# Patient Record
Sex: Female | Born: 1990 | Race: Black or African American | Hispanic: No | Marital: Single | Smoking: Never smoker
Health system: Southern US, Community
[De-identification: ages and names within clinical notes are randomized; demographics above are authoritative.]

## PROBLEM LIST (undated history)

## (undated) ENCOUNTER — Inpatient Hospital Stay (HOSPITAL_COMMUNITY): Payer: Self-pay

## (undated) DIAGNOSIS — Z789 Other specified health status: Secondary | ICD-10-CM

## (undated) HISTORY — DX: Other specified health status: Z78.9

## (undated) HISTORY — PX: NO PAST SURGERIES: SHX2092

---

## 2013-10-31 ENCOUNTER — Encounter (HOSPITAL_COMMUNITY): Payer: Self-pay

## 2013-10-31 ENCOUNTER — Inpatient Hospital Stay (HOSPITAL_COMMUNITY)
Admission: AD | Admit: 2013-10-31 | Discharge: 2013-10-31 | Disposition: A | Discharge status: Home / Self Care | Payer: PRIVATE HEALTH INSURANCE | Source: Ambulatory Visit | Attending: Obstetrics & Gynecology | Admitting: Obstetrics & Gynecology

## 2013-10-31 DIAGNOSIS — O26851 Spotting complicating pregnancy, first trimester: Secondary | ICD-10-CM

## 2013-10-31 DIAGNOSIS — O26859 Spotting complicating pregnancy, unspecified trimester: Secondary | ICD-10-CM | POA: Insufficient documentation

## 2013-10-31 LAB — ABO/RH: ABO/RH(D): O POS

## 2013-10-31 LAB — CBC
HCT: 37.2 % (ref 36.0–46.0)
Hemoglobin: 12.8 g/dL (ref 12.0–15.0)
MCH: 29 pg (ref 26.0–34.0)
MCHC: 34.4 g/dL (ref 30.0–36.0)
MCV: 84.2 fL (ref 78.0–100.0)
PLATELETS: 262 10*3/uL (ref 150–400)
RBC: 4.42 MIL/uL (ref 3.87–5.11)
RDW: 12.1 % (ref 11.5–15.5)
WBC: 8.8 10*3/uL (ref 4.0–10.5)

## 2013-10-31 LAB — HCG, QUANTITATIVE, PREGNANCY: HCG, BETA CHAIN, QUANT, S: 46301 m[IU]/mL — AB (ref <5)

## 2013-10-31 LAB — POCT PREGNANCY, URINE: PREG TEST UR: POSITIVE — AB

## 2013-10-31 NOTE — MAU Note (Signed)
Patient states she has not had a period since May. Had light spotting last week but no pain and no spotting now. Has had a pregnancy test at home with a "light line". Wants confirmation. No complaints at this time.

## 2013-10-31 NOTE — MAU Provider Note (Signed)
Chief Complaint: Possible Pregnancy   First Provider Initiated Contact with Patient 10/31/13 1745     SUBJECTIVE HPI: Terri Hess is a 23 y.o. [id number] at 93107w2d by LMP who presents to maternity admissions reporting light spotting last week, faintly positive pregnancy test 2 weeks ago.  When the bleeding occurred, she was dealing with the death of her sister and was emotionally stressed.  The bleeding was light, pink, and only when wiping.  She has not seen bleeding in 1 week.  She denies abdominal pain, vaginal itching/burning, urinary symptoms, h/a, dizziness, n/v, or fever/chills.    [place] moved [place] and reports she has obstetric history including 2 miscarriages and 2 deliveries at 25 weeks, and 26 weeks, with no living children.    History reviewed. No pertinent past medical history. History reviewed. No pertinent past surgical history. History   Social History  . Marital Status: Single    Spouse Name: N/A    Number of Children: N/A  . Years of Education: N/A   Occupational History  . Not on file.   Social History Main Topics  . Smoking status: Never Smoker   . Smokeless tobacco: Never Used  . Alcohol Use: Not on file  . Drug Use: No  . Sexual Activity: Yes   Other Topics Concern  . Not on file   Social History Narrative  . No narrative on file   No current facility-administered medications on file prior to encounter.   No current outpatient prescriptions on file prior to encounter.   Allergies not on file  ROS: Pertinent items in HPI  OBJECTIVE Blood pressure 110/58, pulse 113, temperature 99.1 F (37.3 C), temperature source Oral, resp. rate 16, height 5\' 3"  (1.6 m), weight 71.759 kg (158 lb 3.2 oz), last menstrual period 08/13/2013, SpO2 99.00%. GENERAL: Well-developed, well-nourished female in no acute distress.  HEENT: Normocephalic HEART: normal rate RESP: normal effort ABDOMEN: Soft, non-tender EXTREMITIES: Nontender, no edema NEURO:  Alert and oriented SPECULUM EXAM: Deferred  LAB RESULTS Results for orders placed during the hospital encounter of 10/31/13 (from the past 24 hour(s))  POCT PREGNANCY, URINE     Status: Abnormal   Collection Time    10/31/13  3:28 PM      Result Value Ref Range   Preg Test, Ur POSITIVE (*) NEGATIVE  HCG, QUANTITATIVE, PREGNANCY     Status: Abnormal   Collection Time    10/31/13  3:41 PM      Result Value Ref Range   hCG, Beta Nyra JabsChain, Quant, [place] [id number] (*) <5 mIU/mL  CBC     Status: None   Collection Time    10/31/13  3:42 PM      Result Value Ref Range   WBC 8.8  4.0 - 10.5 K/uL   RBC 4.42  3.87 - 5.11 MIL/uL   Hemoglobin 12.8  12.0 - 15.0 g/dL   HCT 81.137.2  91.436.0 - 78.246.0 %   MCV 84.2  78.0 - 100.0 fL   MCH 29.0  26.0 - 34.0 pg   MCHC 34.4  30.0 - 36.0 g/dL   RDW 95.612.1  21.311.5 - 08.615.5 %   Platelets 262  150 - 400 K/uL  ABO/RH     Status: None   Collection Time    10/31/13  3:42 PM      Result Value Ref Range   ABO/RH(D) O POS     FHT 165 by doppler   ASSESSMENT 1. Spotting affecting pregnancy in first  trimester     PLAN Discharge home with bleeding precautions Pregnancy verification letter provided Message sent to [facility] to begin care    Medication List    Notice   You have not been prescribed any medications.     Follow-up Information   Follow up with [facility].   Specialty:  Obstetrics and Gynecology   Contact information:   [address] [place] [address] [phone]      Sharen Counter Certified Nurse-Midwife 10/31/2013  6:04 PM

## 2013-11-01 NOTE — MAU Provider Note (Signed)
Attestation of Attending Supervision of Advanced Practitioner: Evaluation and management procedures were performed by the PA/NP/CNM/OB Fellow under my supervision/collaboration. Chart reviewed and agree with management and plan.  Harsh Trulock V 11/01/2013 1:25 AM

## 2013-11-12 ENCOUNTER — Other Ambulatory Visit (HOSPITAL_COMMUNITY)
Admission: RE | Admit: 2013-11-12 | Discharge: 2013-11-12 | Disposition: A | Discharge status: Home / Self Care | Payer: 59 | Source: Ambulatory Visit | Attending: Obstetrics and Gynecology | Admitting: Obstetrics and Gynecology

## 2013-11-12 ENCOUNTER — Encounter: Payer: Self-pay | Admitting: Obstetrics and Gynecology

## 2013-11-12 ENCOUNTER — Ambulatory Visit (INDEPENDENT_AMBULATORY_CARE_PROVIDER_SITE_OTHER): Payer: Medicaid Other | Admitting: Obstetrics and Gynecology

## 2013-11-12 VITALS — BP 108/62 | HR 140 | Temp 98.6°F | Wt 155.6 lb

## 2013-11-12 DIAGNOSIS — IMO0002 Reserved for concepts with insufficient information to code with codable children: Secondary | ICD-10-CM

## 2013-11-12 DIAGNOSIS — O09892 Supervision of other high risk pregnancies, second trimester: Secondary | ICD-10-CM

## 2013-11-12 DIAGNOSIS — O09219 Supervision of pregnancy with history of pre-term labor, unspecified trimester: Secondary | ICD-10-CM

## 2013-11-12 DIAGNOSIS — O4692 Antepartum hemorrhage, unspecified, second trimester: Secondary | ICD-10-CM | POA: Insufficient documentation

## 2013-11-12 DIAGNOSIS — Z113 Encounter for screening for infections with a predominantly sexual mode of transmission: Secondary | ICD-10-CM | POA: Insufficient documentation

## 2013-11-12 DIAGNOSIS — O09212 Supervision of pregnancy with history of pre-term labor, second trimester: Principal | ICD-10-CM

## 2013-11-12 DIAGNOSIS — Z1151 Encounter for screening for human papillomavirus (HPV): Secondary | ICD-10-CM

## 2013-11-12 DIAGNOSIS — N898 Other specified noninflammatory disorders of vagina: Secondary | ICD-10-CM

## 2013-11-12 DIAGNOSIS — Z3480 Encounter for supervision of other normal pregnancy, unspecified trimester: Secondary | ICD-10-CM

## 2013-11-12 DIAGNOSIS — Z01419 Encounter for gynecological examination (general) (routine) without abnormal findings: Secondary | ICD-10-CM | POA: Insufficient documentation

## 2013-11-12 DIAGNOSIS — O4690 Antepartum hemorrhage, unspecified, unspecified trimester: Secondary | ICD-10-CM

## 2013-11-12 DIAGNOSIS — O09292 Supervision of pregnancy with other poor reproductive or obstetric history, second trimester: Secondary | ICD-10-CM

## 2013-11-12 DIAGNOSIS — O99320 Drug use complicating pregnancy, unspecified trimester: Secondary | ICD-10-CM

## 2013-11-12 DIAGNOSIS — Z118 Encounter for screening for other infectious and parasitic diseases: Secondary | ICD-10-CM

## 2013-11-12 DIAGNOSIS — Z124 Encounter for screening for malignant neoplasm of cervix: Secondary | ICD-10-CM

## 2013-11-12 DIAGNOSIS — O09299 Supervision of pregnancy with other poor reproductive or obstetric history, unspecified trimester: Secondary | ICD-10-CM

## 2013-11-12 DIAGNOSIS — F1910 Other psychoactive substance abuse, uncomplicated: Secondary | ICD-10-CM

## 2013-11-12 LAB — POCT URINALYSIS DIP (DEVICE)
BILIRUBIN URINE: NEGATIVE
Glucose, UA: NEGATIVE mg/dL
Nitrite: NEGATIVE
Protein, ur: NEGATIVE mg/dL
SPECIFIC GRAVITY, URINE: 1.02 (ref 1.005–1.030)
Urobilinogen, UA: 0.2 mg/dL (ref 0.0–1.0)
pH: 7 (ref 5.0–8.0)

## 2013-11-12 LAB — TSH: TSH: 0.442 u[IU]/mL (ref 0.350–4.500)

## 2013-11-12 NOTE — Progress Notes (Signed)
Initial OB visit with labs, New OB educational material given, 17P information filled out.

## 2013-11-12 NOTE — Patient Instructions (Signed)
Second Trimester of Pregnancy °The second trimester is from week 13 through week 28, months 4 through 6. The second trimester is often a time when you feel your best. Your body has also adjusted to being pregnant, and you begin to feel better physically. Usually, morning sickness has lessened or quit completely, you may have more energy, and you may have an increase in appetite. The second trimester is also a time when the fetus is growing rapidly. At the end of the sixth month, the fetus is about 9 inches long and weighs about 1½ pounds. You will likely begin to feel the baby move (quickening) between 18 and 20 weeks of the pregnancy. °BODY CHANGES °Your body goes through many changes during pregnancy. The changes vary from woman to woman.  °· Your weight will continue to increase. You will notice your lower abdomen bulging out. °· You may begin to get stretch marks on your hips, abdomen, and breasts. °· You may develop headaches that can be relieved by medicines approved by your health care provider. °· You may urinate more often because the fetus is pressing on your bladder. °· You may develop or continue to have heartburn as a result of your pregnancy. °· You may develop constipation because certain hormones are causing the muscles that push waste through your intestines to slow down. °· You may develop hemorrhoids or swollen, bulging veins (varicose veins). °· You may have back pain because of the weight gain and pregnancy hormones relaxing your joints between the bones in your pelvis and as a result of a shift in weight and the muscles that support your balance. °· Your breasts will continue to grow and be tender. °· Your gums may bleed and may be sensitive to brushing and flossing. °· Dark spots or blotches (chloasma, mask of pregnancy) may develop on your face. This will likely fade after the baby is born. °· A dark line from your belly button to the pubic area (linea nigra) may appear. This will likely fade  after the baby is born. °· You may have changes in your hair. These can include thickening of your hair, rapid growth, and changes in texture. Some women also have hair loss during or after pregnancy, or hair that feels dry or thin. Your hair will most likely return to normal after your baby is born. °WHAT TO EXPECT AT YOUR PRENATAL VISITS °During a routine prenatal visit: °· You will be weighed to make sure you and the fetus are growing normally. °· Your blood pressure will be taken. °· Your abdomen will be measured to track your baby's growth. °· The fetal heartbeat will be listened to. °· Any test results from the previous visit will be discussed. °Your health care provider may ask you: °· How you are feeling. °· If you are feeling the baby move. °· If you have had any abnormal symptoms, such as leaking fluid, bleeding, severe headaches, or abdominal cramping. °· If you have any questions. °Other tests that may be performed during your second trimester include: °· Blood tests that check for: °¨ Low iron levels (anemia). °¨ Gestational diabetes (between 24 and 28 weeks). °¨ Rh antibodies. °· Urine tests to check for infections, diabetes, or protein in the urine. °· An ultrasound to confirm the proper growth and development of the baby. °· An amniocentesis to check for possible genetic problems. °· Fetal screens for spina bifida and Down syndrome. °HOME CARE INSTRUCTIONS  °· Avoid all smoking, herbs, alcohol, and unprescribed   drugs. These chemicals affect the formation and growth of the baby. °· Follow your health care provider's instructions regarding medicine use. There are medicines that are either safe or unsafe to take during pregnancy. °· Exercise only as directed by your health care provider. Experiencing uterine cramps is a good sign to stop exercising. °· Continue to eat regular, healthy meals. °· Wear a good support bra for breast tenderness. °· Do not use hot tubs, steam rooms, or saunas. °· Wear your  seat belt at all times when driving. °· Avoid raw meat, uncooked cheese, cat litter boxes, and soil used by cats. These carry germs that can cause birth defects in the baby. °· Take your prenatal vitamins. °· Try taking a stool softener (if your health care provider approves) if you develop constipation. Eat more high-fiber foods, such as fresh vegetables or fruit and whole grains. Drink plenty of fluids to keep your urine clear or pale yellow. °· Take warm sitz baths to soothe any pain or discomfort caused by hemorrhoids. Use hemorrhoid cream if your health care provider approves. °· If you develop varicose veins, wear support hose. Elevate your feet for 15 minutes, 3-4 times a day. Limit salt in your diet. °· Avoid heavy lifting, wear low heel shoes, and practice good posture. °· Rest with your legs elevated if you have leg cramps or low back pain. °· Visit your dentist if you have not gone yet during your pregnancy. Use a soft toothbrush to brush your teeth and be gentle when you floss. °· A sexual relationship may be continued unless your health care provider directs you otherwise. °· Continue to go to all your prenatal visits as directed by your health care provider. °SEEK MEDICAL CARE IF:  °· You have dizziness. °· You have mild pelvic cramps, pelvic pressure, or nagging pain in the abdominal area. °· You have persistent nausea, vomiting, or diarrhea. °· You have a bad smelling vaginal discharge. °· You have pain with urination. °SEEK IMMEDIATE MEDICAL CARE IF:  °· You have a fever. °· You are leaking fluid from your vagina. °· You have spotting or bleeding from your vagina. °· You have severe abdominal cramping or pain. °· You have rapid weight gain or loss. °· You have shortness of breath with chest pain. °· You notice sudden or extreme swelling of your face, hands, ankles, feet, or legs. °· You have not felt your baby move in over an hour. °· You have severe headaches that do not go away with  medicine. °· You have vision changes. °Document Released: 03/21/2001 Document Revised: 04/01/2013 Document Reviewed: 05/28/2012 °ExitCare® Patient Information ©2015 [employer]. This information is not intended to replace advice given to you by your health care provider. Make sure you discuss any questions you have with your health care provider. °Pelvic Rest °Pelvic rest is sometimes recommended for women when:  °· The placenta is partially or completely covering the opening of the cervix (placenta previa). °· There is bleeding between the uterine wall and the amniotic sac in the first trimester (subchorionic hemorrhage). °· The cervix begins to open without labor starting (incompetent cervix, cervical insufficiency). °· The labor is too early (preterm labor). °HOME CARE INSTRUCTIONS °· Do not have sexual intercourse, stimulation, or an orgasm. °· Do not use tampons, douche, or put anything in the vagina. °· Do not lift anything over 10 pounds (4.5 kg). °· Avoid strenuous activity or straining your pelvic muscles. °SEEK MEDICAL CARE IF:  °· You have any vaginal   bleeding during pregnancy. Treat this as a potential emergency. °· You have cramping pain felt low in the stomach (stronger than menstrual cramps). °· You notice vaginal discharge (watery, mucus, or bloody). °· You have a low, dull backache. °· There are regular contractions or uterine tightening. °SEEK IMMEDIATE MEDICAL CARE IF: °You have vaginal bleeding and have placenta previa.  °Document Released: 07/22/2010 Document Revised: 06/19/2011 Document Reviewed: 07/22/2010 °ExitCare® Patient Information ©2015 [employer]. This information is not intended to replace advice given to you by your health care provider. Make sure you discuss any questions you have with your health care provider. ° °

## 2013-11-12 NOTE — Progress Notes (Signed)
Ms. Beverely PaceBryant has a significant OB hx. She has a history of 2 still births in 2011 + 2013. She has also had two miscarriages.  P/t states that she has had vaginal spotting since October 18, 2013. She describes it as light and not as heavy as her previous still births.  P/t stated that she might consider genetic testing if this pregnancy doesn't work well.   Additional OB Labs: Ultrasound, TSH, and CBC.

## 2013-11-12 NOTE — Progress Notes (Signed)
Subjective:    Terri Hess is a [id number] at [redacted]w[redacted]d being seen today for her first obstetrical visit. Her obstetrical history is significant for poor Ob Hx. 2011: PTL at 24 wks, demise at 25 wks.; 2013: fetal anomaly ?type, demise at 26 wks.  Patient does intend to breast feed. Pregnancy history fully reviewed, however records not available at time of visit.   Patient reports bleeding, no cramping and no leaking. Reports light pink spotting that is not postcoital since 10/18/13, last episode this am.   Filed Vitals:   11/12/13 1028  BP: 108/62  Pulse: 140  Temp: 98.6 F (37 C)  Weight: 155 lb 9.6 oz (70.58 kg)    HISTORY: OB History  Gravida Para Term Preterm AB SAB TAB Ectopic Multiple Living  5 2 0 2 2 2 0 0 0 0     # Outcome Date GA Lbr Len/2nd Weight Sex Delivery Anes PTL Lv  5 CUR           4 PRE 01/14/12 [redacted]w[redacted]d   F SVD   N  3 PRE 07/15/09 [redacted]w[redacted]d   M SVD  Y N  2 SAB           1 SAB             Early SABs x2, no D&C  Past Medical History  Diagnosis Date  . Medical history non-contributory   Neg hx thyroid d/o  Past Surgical History  Procedure Laterality Date  . No past surgeries     Family History  Problem Relation Age of Onset  . Diabetes Mother   . Cancer Mother     Pancreatic  . Hypertension Father   . Cancer Father     Colon  . Arthritis Father   . Heart disease Sister   . Kidney disease Sister   . Lupus Sister   . Early death Sister   . Miscarriages / Stillbirths Sister   . Diabetes Brother   . Diabetes Maternal Aunt   . Diabetes Maternal Uncle   . Hypertension Paternal Aunt   . Miscarriages / Stillbirths Paternal Aunt   . Diabetes Maternal Grandmother   . Kidney disease Maternal Grandmother   . Hypertension Paternal Grandmother   . Hypertension Paternal Grandfather   . Hypertension Sister   . Diabetes Sister   . Diabetes Brother   . Hypertension Brother      Exam  DT FHR 150  Uterus:   12-14 wk size  Pelvic Exam:    Perineum: No  Hemorrhoids, Normal Perineum   Vulva: normal, Bartholin's, Urethra, Skene's normal   Vagina:  normal mucosa, scant blood, wet prep done, scant brown-tinged d/c       Cervix: no bleeding following Pap and no lesions 2.5-3 cm long, closed   Adnexa: not evaluated   Bony Pelvis: average  System: Breast:  normal appearance, no masses or tenderness   Skin: normal coloration and turgor, no rashes    Neurologic: oriented, normal, grossly non-focal   Extremities: normal strength, tone, and muscle mass   HEENT PERRLA, extra ocular movement intact, trachea midline and thyroid feels enlarged smooth   Mouth/Teeth mucous membranes moist, pharynx normal without lesions and dental hygiene good   Neck supple   Cardiovascular: regular rate and rhythm, no murmurs or gallops   Respiratory:  appears well, vitals normal, no respiratory distress, acyanotic, normal RR, ear and throat exam is normal, neck free of mass or lymphadenopathy, chest clear, no wheezing, crepitations, rhonchi,  normal symmetric air entry   Abdomen: soft, non-tender; bowel sounds normal; no masses,  no organomegaly   Urinary: urethral meatus normal      Assessment:    Pregnancy: [id number] Patient Active Problem List   Diagnosis Date Noted  . History of preterm delivery, currently pregnant in second trimester 11/12/2013  . Prior pregnancy with fetal demise and current pregnancy in second trimester 11/12/2013  . Second trimester bleeding 11/12/2013  . H/O fetal anomaly in prior pregnancy, currently pregnant 11/12/2013   Cervical insufficiency by pt hx (records pending)      Plan:     Initial labs drawn. TSH sent, 1 hr OGTT Prenatal vitamins. Problem list reviewed and updated. Genetic Screening discussed Integrated Screen: undecided.  Ultrasound discussed; fetal survey: requested.  Follow up in 3 weeks. 50% of 30 min visit spent on counseling and coordination of care.  MFM referral within a week for recommendations re:  prophylactic cerclage, fetal surveillance and genetic counseling.   ROI records of previous deliveries Pelvic rest and bleeding precautions 17-P papers completed  Terri Hess 11/12/2013

## 2013-11-13 LAB — OBSTETRIC PANEL
ANTIBODY SCREEN: NEGATIVE
BASOS ABS: 0 10*3/uL (ref 0.0–0.1)
BASOS PCT: 0 % (ref 0–1)
EOS PCT: 1 % (ref 0–5)
Eosinophils Absolute: 0.1 10*3/uL (ref 0.0–0.7)
HEMATOCRIT: 35.1 % — AB (ref 36.0–46.0)
Hemoglobin: 12.4 g/dL (ref 12.0–15.0)
Hepatitis B Surface Ag: NEGATIVE
Lymphocytes Relative: 32 % (ref 12–46)
Lymphs Abs: 2.2 10*3/uL (ref 0.7–4.0)
MCH: 28.5 pg (ref 26.0–34.0)
MCHC: 35.3 g/dL (ref 30.0–36.0)
MCV: 80.7 fL (ref 78.0–100.0)
MONO ABS: 0.5 10*3/uL (ref 0.1–1.0)
Monocytes Relative: 8 % (ref 3–12)
Neutro Abs: 4 10*3/uL (ref 1.7–7.7)
Neutrophils Relative %: 59 % (ref 43–77)
Platelets: 292 10*3/uL (ref 150–400)
RBC: 4.35 MIL/uL (ref 3.87–5.11)
RDW: 12.6 % (ref 11.5–15.5)
RUBELLA: 6.56 {index} — AB (ref <0.90)
Rh Type: POSITIVE
WBC: 6.8 10*3/uL (ref 4.0–10.5)

## 2013-11-13 LAB — CYTOLOGY - PAP

## 2013-11-13 LAB — GLUCOSE TOLERANCE, 1 HOUR (50G) W/O FASTING: GLUCOSE 1 HOUR GTT: 94 mg/dL (ref 70–140)

## 2013-11-13 LAB — WET PREP, GENITAL
CLUE CELLS WET PREP: NONE SEEN
TRICH WET PREP: NONE SEEN
WBC WET PREP: NONE SEEN
Yeast Wet Prep HPF POC: NONE SEEN

## 2013-11-13 LAB — HIV ANTIBODY (ROUTINE TESTING W REFLEX): HIV 1&2 Ab, 4th Generation: NONREACTIVE

## 2013-11-14 LAB — HEMOGLOBINOPATHY EVALUATION
Hemoglobin Other: 0 %
Hgb A2 Quant: 2.6 % (ref 2.2–3.2)
Hgb A: 97.4 % (ref 96.8–97.8)
Hgb F Quant: 0 % (ref 0.0–2.0)
Hgb S Quant: 0 %

## 2013-11-14 LAB — CULTURE, OB URINE
Colony Count: NO GROWTH
Organism ID, Bacteria: NO GROWTH

## 2013-11-14 LAB — CANNABANOIDS (GC/LC/MS), URINE: THC-COOH UR CONFIRM: 1167 ng/mL — AB (ref <5)

## 2013-11-15 LAB — PRESCRIPTION MONITORING PROFILE (19 PANEL)
Amphetamine/Meth: NEGATIVE ng/mL
Barbiturate Screen, Urine: NEGATIVE ng/mL
Benzodiazepine Screen, Urine: NEGATIVE ng/mL
Buprenorphine, Urine: NEGATIVE ng/mL
CARISOPRODOL, URINE: NEGATIVE ng/mL
CREATININE, URINE: 274.87 mg/dL (ref >20.0)
Cocaine Metabolites: NEGATIVE ng/mL
ECSTASY: NEGATIVE ng/mL
Fentanyl, Ur: NEGATIVE ng/mL
MEPERIDINE UR: NEGATIVE ng/mL
METHADONE SCREEN, URINE: NEGATIVE ng/mL
Methaqualone: NEGATIVE ng/mL
NITRITES URINE, INITIAL: NEGATIVE ug/mL
Opiate Screen, Urine: NEGATIVE ng/mL
Oxycodone Screen, Ur: NEGATIVE ng/mL
PHENCYCLIDINE, UR: NEGATIVE ng/mL
Propoxyphene: NEGATIVE ng/mL
TAPENTADOLUR: NEGATIVE ng/mL
Tramadol Scrn, Ur: NEGATIVE ng/mL
Zolpidem, Urine: NEGATIVE ng/mL
pH, Initial: 7.2 pH (ref 4.5–8.9)

## 2013-11-19 DIAGNOSIS — O99320 Drug use complicating pregnancy, unspecified trimester: Secondary | ICD-10-CM | POA: Insufficient documentation

## 2013-11-19 DIAGNOSIS — F1910 Other psychoactive substance abuse, uncomplicated: Secondary | ICD-10-CM | POA: Insufficient documentation

## 2013-11-21 ENCOUNTER — Ambulatory Visit (HOSPITAL_COMMUNITY)
Admission: RE | Admit: 2013-11-21 | Discharge: 2013-11-21 | Disposition: A | Discharge status: Home / Self Care | Payer: Medicaid Other | Source: Ambulatory Visit | Attending: Family | Admitting: Family

## 2013-11-21 ENCOUNTER — Ambulatory Visit (HOSPITAL_COMMUNITY)
Admission: RE | Admit: 2013-11-21 | Discharge: 2013-11-21 | Disposition: A | Discharge status: Home / Self Care | Payer: Medicaid Other | Source: Ambulatory Visit

## 2013-11-21 ENCOUNTER — Ambulatory Visit (HOSPITAL_COMMUNITY)
Admission: RE | Admit: 2013-11-21 | Discharge: 2013-11-21 | Disposition: A | Discharge status: Home / Self Care | Payer: Medicaid Other | Source: Ambulatory Visit | Attending: Obstetrics and Gynecology | Admitting: Obstetrics and Gynecology

## 2013-11-21 ENCOUNTER — Encounter (HOSPITAL_COMMUNITY): Payer: Self-pay

## 2013-11-21 ENCOUNTER — Other Ambulatory Visit: Payer: Self-pay | Admitting: Obstetrics and Gynecology

## 2013-11-21 ENCOUNTER — Other Ambulatory Visit: Payer: Self-pay

## 2013-11-21 VITALS — BP 119/52 | HR 99 | Wt 157.2 lb

## 2013-11-21 DIAGNOSIS — O99320 Drug use complicating pregnancy, unspecified trimester: Secondary | ICD-10-CM

## 2013-11-21 DIAGNOSIS — F1910 Other psychoactive substance abuse, uncomplicated: Secondary | ICD-10-CM | POA: Diagnosis not present

## 2013-11-21 DIAGNOSIS — O09292 Supervision of pregnancy with other poor reproductive or obstetric history, second trimester: Secondary | ICD-10-CM

## 2013-11-21 DIAGNOSIS — O4692 Antepartum hemorrhage, unspecified, second trimester: Secondary | ICD-10-CM

## 2013-11-21 DIAGNOSIS — IMO0002 Reserved for concepts with insufficient information to code with codable children: Secondary | ICD-10-CM | POA: Diagnosis not present

## 2013-11-21 DIAGNOSIS — O99340 Other mental disorders complicating pregnancy, unspecified trimester: Secondary | ICD-10-CM | POA: Insufficient documentation

## 2013-11-21 DIAGNOSIS — O09212 Supervision of pregnancy with history of pre-term labor, second trimester: Secondary | ICD-10-CM

## 2013-11-21 DIAGNOSIS — O09219 Supervision of pregnancy with history of pre-term labor, unspecified trimester: Secondary | ICD-10-CM | POA: Diagnosis not present

## 2013-11-21 DIAGNOSIS — N898 Other specified noninflammatory disorders of vagina: Secondary | ICD-10-CM

## 2013-11-21 DIAGNOSIS — O09892 Supervision of other high risk pregnancies, second trimester: Secondary | ICD-10-CM

## 2013-11-21 DIAGNOSIS — O09299 Supervision of pregnancy with other poor reproductive or obstetric history, unspecified trimester: Secondary | ICD-10-CM | POA: Diagnosis not present

## 2013-11-21 NOTE — Progress Notes (Signed)
Genetic Counseling  High-Risk Gestation Note  Appointment Date:  11/21/2013 Referred By: Terri Hess, CNM Date of Birth:  07-20-1990   Pregnancy History: [id number] Estimated Date of Delivery: 05/20/14 Estimated Gestational Age: 29w2dAttending: [name]   I met with Ms. Terri Heritagefor genetic counseling because of a previous pregnancy with abnormal ultrasound findings. Ms. BGronerwas accompanied by her sister to today's visit. The patient was also seen for Maternal-Fetal Medicine consultation regarding her pregnancy history. See separate consult note for detailed discussion.   We began by reviewing the patient's pregnancy history. She reported that her first two pregnancies resulted in early miscarriage. Her third pregnancy resulted in pregnancy loss at approximately [redacted] weeks gestation.  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 [facility]. She reported that she planned to return for an amniocentesis but had car trouble and was also unsure if she wanted the procedure. This pregnancy, resulted in a demise at approximately 277 weeks   We discussed that a cystic hygroma describes a septated fluid filled sac at the back of the neck that typically results from failure of the fetal lymphatic system.  We discussed that there are various etiologies for a hygroma including normal variation (immature lymphatic system), a chromosome condition, single gene condition, or a congenital anomaly (heart defect). We discussed chromosomes, non-disjunction, age related risks for aneuploidy, and the 50-60% ultrasound adjusted risk for a chromosome condition based on the finding of a cystic hygroma.  We discussed that these ultrasound findings can also be due to other causes such as single gene conditions, heart defects, or sporadic causes.   Given the possibility of an  underlying chromosome condition in the previous pregnancy, we discussed the option of noninvasive prenatal screening (NIPS)/cell free DNA testing. We discussed the conditions for which it screens, the detection rates, and false positive rates. She understands that NIPS does not diagnose or rule out these conditions. She also understands that without an identified etiology for the ultrasound findings in the pregnancy, this screen may not assess for the underlying cause that was present in the previous pregnancy. After careful consideration, Ms. BMaveselected to proceed with NIPS (Panorama) at the time of today's visit.   We discussed that ultrasound would be available in the pregnancy to assess for signs of cystic hygroma and hydrops. She understands that ultrasound cannot diagnose or rule out all birth defects or genetic conditions. Nuchal translucency ultrasound was performed today. Nuchal translucency measurement was within normal range. Complete ultrasound results reported separately.   The family histories were otherwise found to be noncontributory for birth defects, intellectual disability, and known genetic conditions. Without further information regarding the provided family history, an accurate genetic risk cannot be calculated. Further genetic counseling is warranted if more information is obtained.   I counseled Ms. Terri Heritageregarding the above risks and available options.  The approximate face-to-face time with the genetic counselor was 20 minutes.  [name] Certified Genetic Counselor 11/21/2013

## 2013-11-26 ENCOUNTER — Telehealth: Payer: Self-pay

## 2013-11-26 DIAGNOSIS — A599 Trichomoniasis, unspecified: Secondary | ICD-10-CM

## 2013-11-26 MED ORDER — METRONIDAZOLE 500 MG PO TABS: 2000.0000 mg | ORAL_TABLET | Freq: Once | ORAL | Status: AC

## 2013-11-26 NOTE — Telephone Encounter (Signed)
Trich found on pap. Flagyl 2gm PO prescribed per protocol. Attempted to call patient on mobile number-- no answer-- left message stating we are calling with results, please call clinic. Attempted home number-- no voicemail set up-- unable to leave message.

## 2013-11-26 NOTE — Telephone Encounter (Signed)
Called patient and informed her of results. Patient states she just picked up RX. Advised she ensure her partner is treated and they abstain from sex for atleast 1 week after being treated. Patient verbalized understanding and gratitude. No questions or concerns.

## 2013-11-28 ENCOUNTER — Telehealth (HOSPITAL_COMMUNITY): Payer: Self-pay | Admitting: MS"

## 2013-11-28 NOTE — Telephone Encounter (Signed)
Called Terri Hess to discuss her cell free fetal DNA test results.  Ms. Terri Hess had Panorama testing through [facility].  Testing was offered because of previous pregnancy with anomalies and possibly underlying fetal aneuploidy.   The patient was identified by name and DOB.  We reviewed that these are within normal limits, showing a less than 1 in 10,000 risk for trisomies 21, 18 and 13, and monosomy X (Turner syndrome).  In addition, the risk for triploidy/vanishing twin and sex chromosome trisomies (47,XXX and 47,XXY) was also low risk.  We reviewed that this testing identifies > 99% of pregnancies with trisomy 921, trisomy 6513, sex chromosome trisomies (47,XXX and 47,XXY), and triploidy. The detection rate for trisomy 18 is 96%.  The detection rate for monosomy X is ~92%.  The false positive rate is <0.1% for all conditions. Testing was also consistent with female fetal sex.  The patient did wish to know fetal sex.  She understands that this testing does not identify all genetic conditions.  All questions were answered to her satisfaction, she was encouraged to call with additional questions or concerns.  Quinn PlowmanKaren Mertha Clyatt, MS Certified Genetic Counselor 11/28/2013 4:52 PM

## 2013-11-28 NOTE — Telephone Encounter (Signed)
Attempted to call patient regarding normal cell free DNA testing results (Panorama). Patient does not have voicemail activated on her phone. She previously gave us permission to call her sister, Archie Pattenonya, who was present for her appointment. Left message on Tonya's voicemail for patient to return call.  Clydie BraunKaren Trayden Brandy 11/28/2013 1:40 PM

## 2013-12-02 ENCOUNTER — Other Ambulatory Visit: Payer: Self-pay | Admitting: *Deleted

## 2013-12-03 ENCOUNTER — Encounter: Payer: Self-pay | Admitting: General Practice

## 2013-12-03 ENCOUNTER — Encounter: Payer: Self-pay | Admitting: *Deleted

## 2013-12-10 ENCOUNTER — Encounter: Payer: Self-pay | Admitting: Family

## 2013-12-10 ENCOUNTER — Encounter: Payer: Medicaid Other | Admitting: Family

## 2014-02-05 ENCOUNTER — Ambulatory Visit (HOSPITAL_COMMUNITY)
Admission: RE | Admit: 2014-02-05 | Discharge: 2014-02-05 | Disposition: A | Discharge status: Home / Self Care | Payer: Medicaid Other | Source: Ambulatory Visit | Attending: Obstetrics & Gynecology | Admitting: Obstetrics & Gynecology

## 2014-02-05 DIAGNOSIS — Z3A00 Weeks of gestation of pregnancy not specified: Secondary | ICD-10-CM | POA: Diagnosis not present

## 2014-02-05 MED ORDER — BETAMETHASONE SOD PHOS & ACET 6 (3-3) MG/ML IJ SUSP
12.0000 mg | Freq: Once | INTRAMUSCULAR | Status: AC
  Administered 2014-02-05: 12 mg via INTRAMUSCULAR
  Filled 2014-02-05: qty 2

## 2014-02-06 ENCOUNTER — Encounter: Payer: Self-pay | Admitting: General Practice

## 2014-02-10 ENCOUNTER — Encounter (HOSPITAL_COMMUNITY): Payer: Self-pay

## 2014-05-06 ENCOUNTER — Inpatient Hospital Stay (HOSPITAL_COMMUNITY)
Admission: AD | Admit: 2014-05-06 | Discharge: 2014-05-06 | Disposition: A | Discharge status: Home / Self Care | Payer: Medicaid Other | Source: Ambulatory Visit | Attending: Obstetrics & Gynecology | Admitting: Obstetrics & Gynecology

## 2014-05-06 ENCOUNTER — Encounter (HOSPITAL_COMMUNITY): Payer: Self-pay | Admitting: *Deleted

## 2014-05-06 DIAGNOSIS — Z3A38 38 weeks gestation of pregnancy: Secondary | ICD-10-CM | POA: Diagnosis not present

## 2014-05-06 DIAGNOSIS — O471 False labor at or after 37 completed weeks of gestation: Secondary | ICD-10-CM | POA: Insufficient documentation

## 2014-05-06 NOTE — MAU Note (Signed)
C/o ucs since midnight last night;denies any bleeding or SROM:

## 2014-05-06 NOTE — Discharge Instructions (Signed)

## 2014-05-08 ENCOUNTER — Inpatient Hospital Stay (HOSPITAL_COMMUNITY)
Admission: AD | Admit: 2014-05-08 | Discharge: 2014-05-08 | Disposition: A | Discharge status: Home / Self Care | Payer: Medicaid Other | Source: Ambulatory Visit | Attending: Obstetrics and Gynecology | Admitting: Obstetrics and Gynecology

## 2014-05-08 ENCOUNTER — Encounter (HOSPITAL_COMMUNITY): Payer: Self-pay | Admitting: *Deleted

## 2014-05-08 DIAGNOSIS — R109 Unspecified abdominal pain: Secondary | ICD-10-CM | POA: Insufficient documentation

## 2014-05-08 DIAGNOSIS — Z3A37 37 weeks gestation of pregnancy: Secondary | ICD-10-CM | POA: Insufficient documentation

## 2014-05-08 DIAGNOSIS — O9989 Other specified diseases and conditions complicating pregnancy, childbirth and the puerperium: Secondary | ICD-10-CM | POA: Insufficient documentation

## 2014-05-08 NOTE — MAU Note (Signed)
Been in a lot of pain and vomiting.  Pains started about 4. No leaking or bleeding.  Was 1+ when last checked.

## 2014-05-08 NOTE — Discharge Instructions (Signed)

## 2014-09-05 ENCOUNTER — Encounter (HOSPITAL_COMMUNITY): Payer: Self-pay | Admitting: *Deleted

## 2014-11-06 ENCOUNTER — Other Ambulatory Visit (HOSPITAL_COMMUNITY): Payer: Self-pay | Admitting: Obstetrics and Gynecology

## 2015-08-01 IMAGING — US US MFM OB COMPLETE +14 WKS
1 series · 13 of 28 positions shown · non-contrast
Comparison: none

[Series 1: us mfm ob complete +14 wks · 0.21mm/px · 13 of 31 slices shown]
[im 2/31]
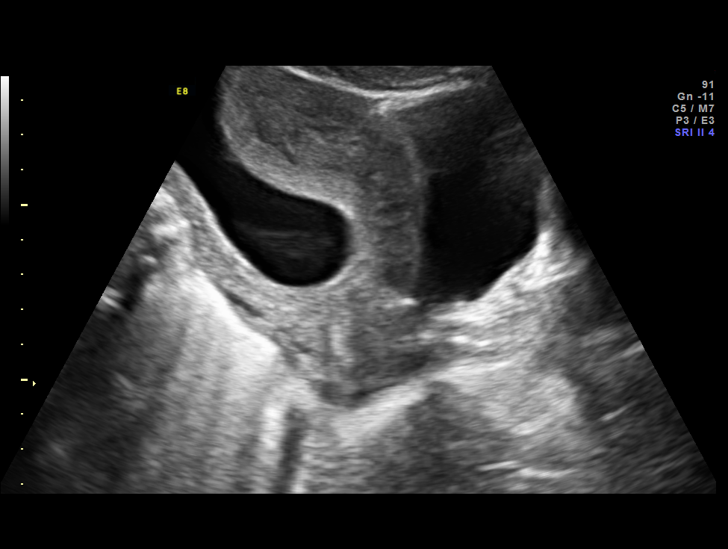
[im 4/31]
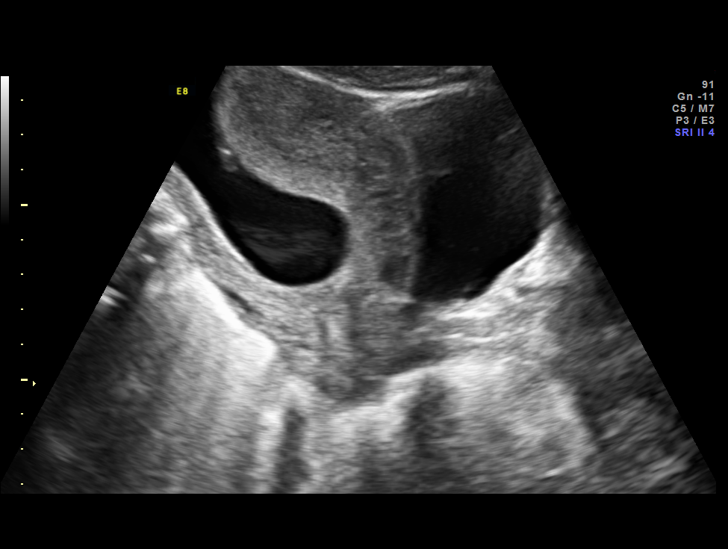
[im 6/31]
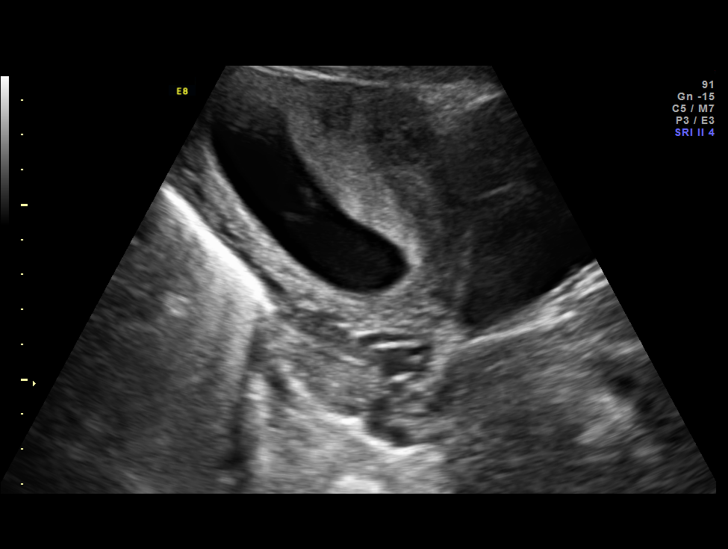
[im 8/31]
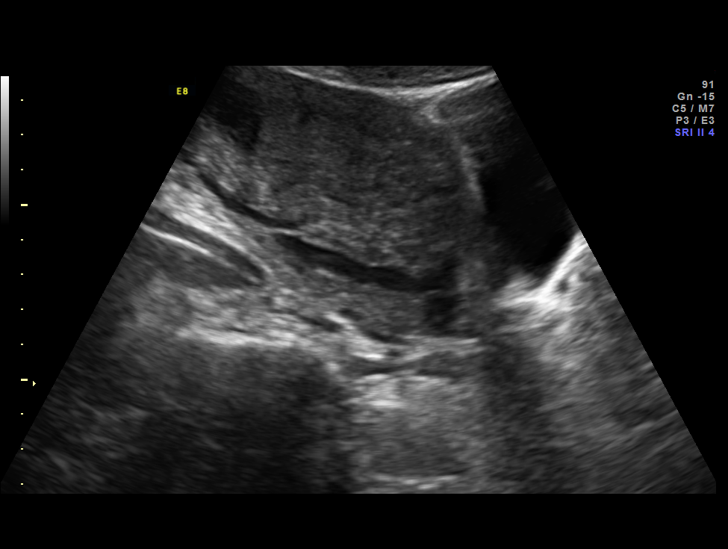
[im 11/31]
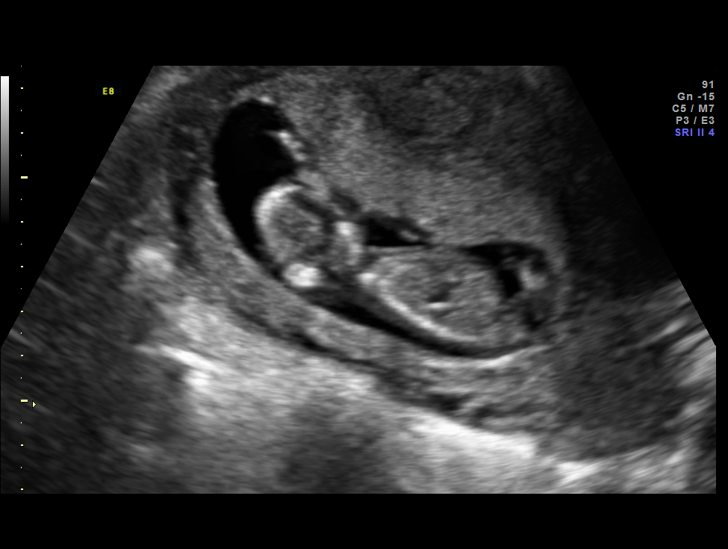
[im 13/31]
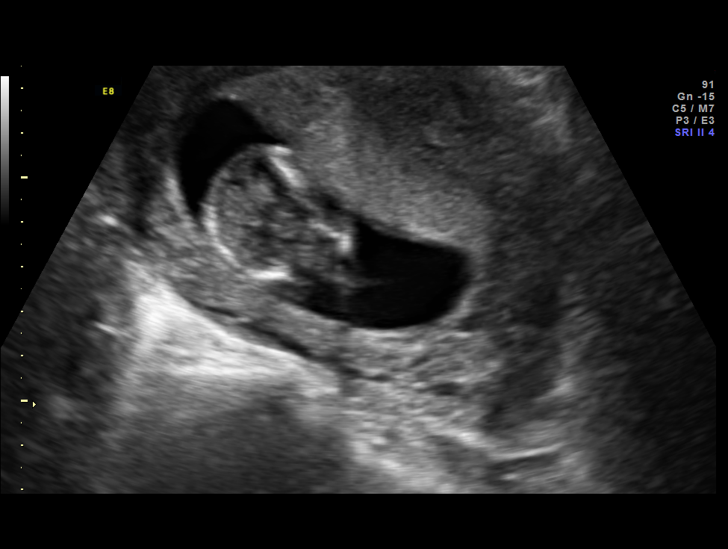
[im 16/31]
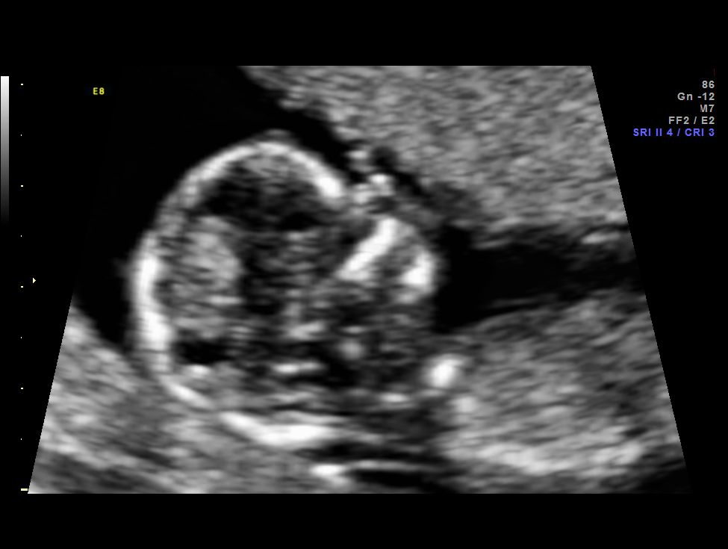
[im 18/31]
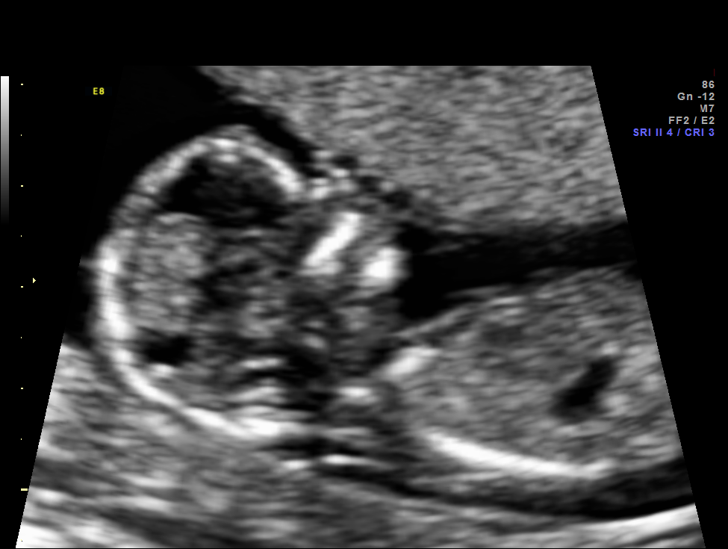
[im 21/31]
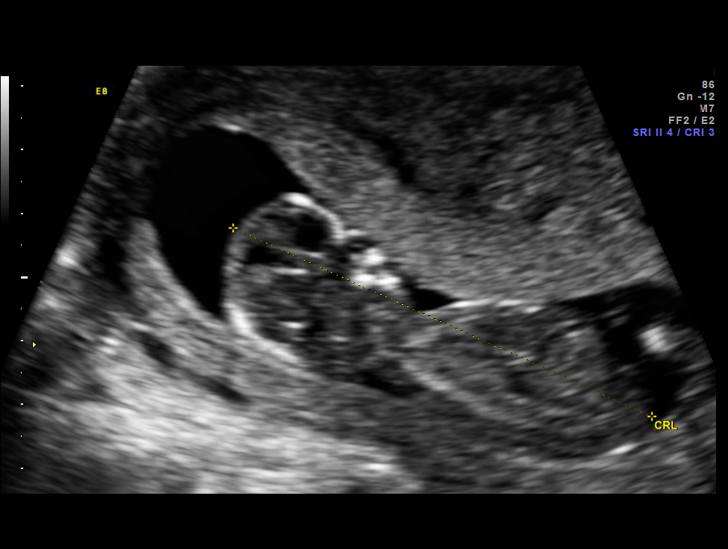
[im 23/31]
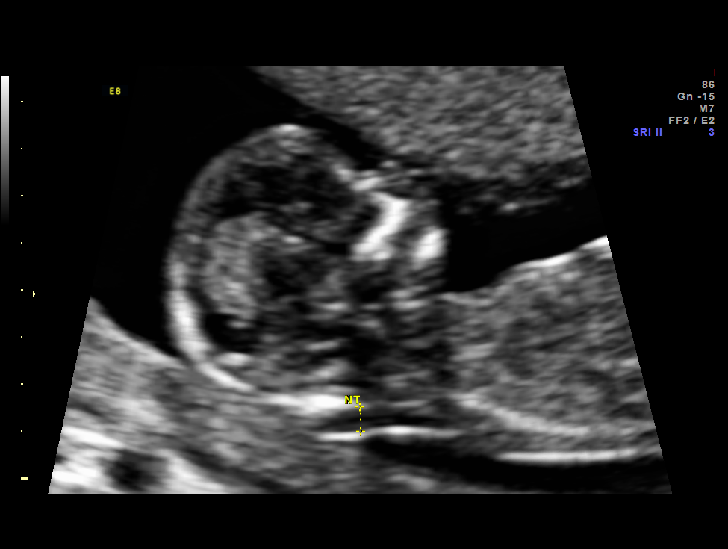
[im 25/31]
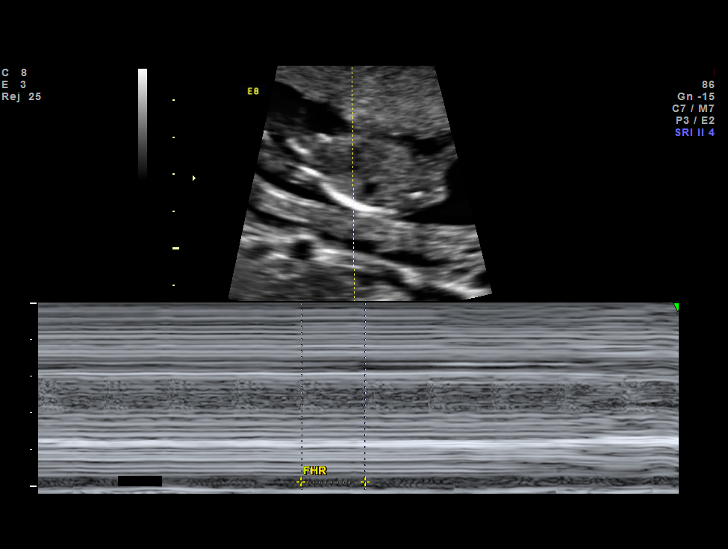
[im 27/31]
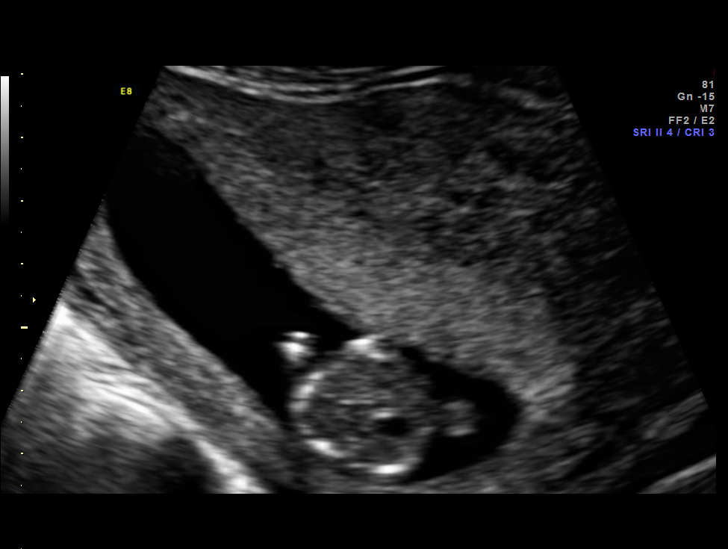
[im 29/31]
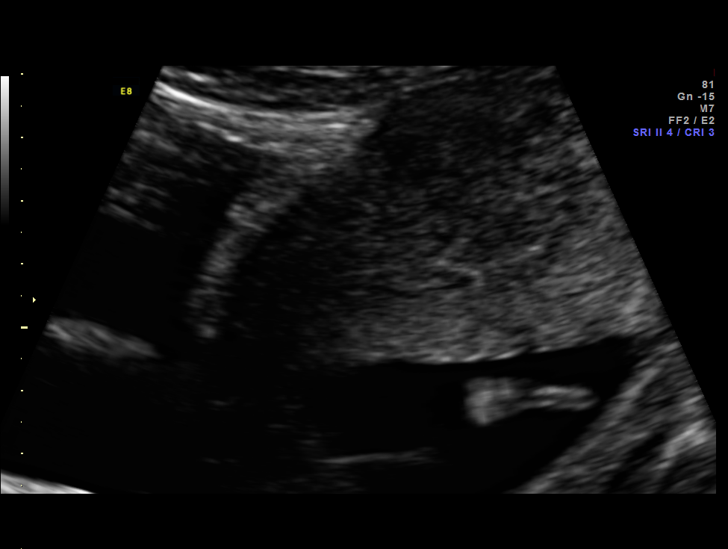

[13 of 28 positions shown; findings below may reference images not displayed]

OBSTETRICS REPORT
                      (Signed Final 11/21/2013 [DATE])

Service(s) Provided

 US MFM OB COMP LESS THAN 14 WEEKS                     76801.4
Indications

 Poor obstetric history: Previous IUFD (stillbirth) 25
 weeks
 History of genetic / anatomic abnormality -
 previous preg.
Fetal Evaluation

 Num Of Fetuses:    1
 Fetal Heart Rate:  157                          bpm
 Cardiac Activity:  Observed
 Presentation:      Breech
 Placenta:          Anterior, above cervical os
 P. Cord            Visualized
 Insertion:

 Amniotic Fluid
 AFI FV:      Subjectively within normal limits
Gestational Age

 LMP:           14w 2d        Date:  08/13/13                 EDD:   05/20/14
1st Trimester Genetic Sonogram Screening

 CRL:            72.7  mm    G. Age:   13w 1d                 EDD:   05/28/14
 Nuc Trans:       2.6  mm
 Nasal Bone:                 Present
Anatomy

 Cranium:          Appears normal         Stomach:          Appears normal, left
                                                            sided
 Choroid Plexus:   Appears normal         Lower             2
                                          Extremities:
 Face:             Profile nl; orbits not Upper             2
                   well visualized        Extremities:
Cervix Uterus Adnexa
 Cervix:       Normal appearance by transabdominal scan.

 Left Ovary:    Within normal limits.
 Right Ovary:   Within normal limits.
Impression

 SIUP at 13+1 weeks
 No gross abnormalities identified
 NT measurement was in the upper normal range for this GA;
 NB present
 Normal amniotic fluid volume
 EDC based on today's measurements; EDC 05/28/14

 Ms. Vitor Marques declined first trimester screening. After the
 consult, she opted to have cell free DNA screening
 (Panorama)
Recommendations

 Follow-up ultrasound for cervical length in 3 weeks
 Follow-up ultrasound in 5 weeks for detailed anatomic survey
 and CL
